# Patient Record
Sex: Male | Born: 1990 | Race: Black or African American | Hispanic: No | Marital: Single | Smoking: Current every day smoker
Health system: Southern US, Community
[De-identification: ages and names within clinical notes are randomized; demographics above are authoritative.]

---
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
## 2011-12-06 ENCOUNTER — Encounter (HOSPITAL_COMMUNITY): Payer: Self-pay | Admitting: Emergency Medicine

## 2011-12-06 ENCOUNTER — Emergency Department (HOSPITAL_COMMUNITY)
Admission: EM | Admit: 2011-12-06 | Discharge: 2011-12-07 | Disposition: A | Discharge status: Home / Self Care | Payer: Self-pay | Attending: Emergency Medicine | Admitting: Emergency Medicine

## 2011-12-06 DIAGNOSIS — R369 Urethral discharge, unspecified: Secondary | ICD-10-CM | POA: Insufficient documentation

## 2011-12-06 DIAGNOSIS — R319 Hematuria, unspecified: Secondary | ICD-10-CM | POA: Insufficient documentation

## 2011-12-06 DIAGNOSIS — R300 Dysuria: Secondary | ICD-10-CM | POA: Insufficient documentation

## 2011-12-06 DIAGNOSIS — N342 Other urethritis: Secondary | ICD-10-CM | POA: Insufficient documentation

## 2011-12-06 NOTE — ED Notes (Signed)
Pt alert, nad, c/o urinary discharge, burning, onset this evening, resp even unlabored, skin pwd

## 2011-12-07 LAB — URINALYSIS, ROUTINE W REFLEX MICROSCOPIC
Glucose, UA: NEGATIVE mg/dL
Ketones, ur: NEGATIVE mg/dL
Specific Gravity, Urine: 1.028 (ref 1.005–1.030)
Urobilinogen, UA: 0.2 mg/dL (ref 0.0–1.0)
pH: 6.5 (ref 5.0–8.0)

## 2011-12-07 LAB — URINE MICROSCOPIC-ADD ON

## 2011-12-07 LAB — GC/CHLAMYDIA PROBE AMP, GENITAL
Chlamydia, DNA Probe: POSITIVE — AB
GC Probe Amp, Genital: NEGATIVE

## 2011-12-07 MED ORDER — AZITHROMYCIN 250 MG PO TABS
1000.0000 mg | ORAL_TABLET | Freq: Once | ORAL | Status: AC
  Administered 2011-12-07: 1000 mg via ORAL
  Filled 2011-12-07: qty 4

## 2011-12-07 MED ORDER — METRONIDAZOLE 500 MG PO TABS
2000.0000 mg | ORAL_TABLET | Freq: Once | ORAL | Status: AC
  Administered 2011-12-07: 2000 mg via ORAL
  Filled 2011-12-07: qty 4

## 2011-12-07 MED ORDER — CEFTRIAXONE SODIUM 250 MG IJ SOLR
250.0000 mg | Freq: Once | INTRAMUSCULAR | Status: AC
  Administered 2011-12-07: 250 mg via INTRAMUSCULAR
  Filled 2011-12-07: qty 250

## 2011-12-07 MED ORDER — LIDOCAINE HCL 1 % IJ SOLN
INTRAMUSCULAR | Status: AC
  Administered 2011-12-07: 20 mL
  Filled 2011-12-07: qty 20

## 2011-12-07 NOTE — ED Provider Notes (Signed)
History     CSN: [id number]  Arrival date & time 12/06/11  2352   First MD Initiated Contact with Patient 12/07/11 0043      Chief Complaint  Patient presents with  . Urinary Tract Infection     HPI  History provided by the patient. Patient is a 21 year old male with no significant past medical history who presents with reports of having acute onset of blood in his urine his evening. he first noticed blood in the total after urinating. He reports having slight urinary discharge as well and possible burning sensation. Symptoms were acute and episodic. He denies any other aggravating or alleviating factors. Those are described as mild. He denies any testicular pain or swelling. Denies rash or change of skin. He denies having similar symptoms previously. Patient is sexually active but reports using condoms every time.    History reviewed. No pertinent past medical history.  History reviewed. No pertinent past surgical history.  No family history on file.  History  Substance Use Topics  . Smoking status: Current Everyday Smoker -- 1.0 packs/day    Types: Cigarettes  . Smokeless tobacco: Not on file  . Alcohol Use: No      Review of Systems  Constitutional: Negative for fever and chills.  Genitourinary: Positive for hematuria and discharge. Negative for frequency, flank pain, penile swelling, penile pain and testicular pain.  All other systems reviewed and are negative.    Allergies  Aspirin and Keflex  Home Medications  No current outpatient prescriptions on file.  BP 128/79  Pulse 70  Temp 98 F (36.7 C)  Resp 16  Wt 155 lb (70.308 kg)  SpO2 99%  Physical Exam  Nursing note and vitals reviewed. Constitutional: He is oriented to person, place, and time. He appears well-developed and well-nourished. No distress.  HENT:  Head: Normocephalic.  Cardiovascular: Normal rate and regular rhythm.   Pulmonary/Chest: Effort normal and breath sounds normal.  Abdominal:  Soft. Bowel sounds are normal. He exhibits no distension. There is no tenderness. There is no rebound, no guarding and no CVA tenderness. Hernia confirmed negative in the right inguinal area and confirmed negative in the left inguinal area.  Genitourinary: Testes normal and penis normal. Right testis shows no swelling and no tenderness. Left testis shows no swelling and no tenderness. Circumcised. No penile erythema or penile tenderness. No discharge found.  Lymphadenopathy:       Right: No inguinal adenopathy present.       Left: No inguinal adenopathy present.  Neurological: He is alert and oriented to person, place, and time.  Skin: Skin is warm. No rash noted.  Psychiatric: He has a normal mood and affect.    ED Course  Procedures    Labs Reviewed  URINALYSIS, ROUTINE W REFLEX MICROSCOPIC  URINE CULTURE  GC/CHLAMYDIA PROBE AMP, GENITAL   Results for orders placed during the hospital encounter of 12/06/11  URINALYSIS, ROUTINE W REFLEX MICROSCOPIC      Component Value Range   Color, Urine YELLOW  YELLOW    APPearance CLEAR  CLEAR    Specific Gravity, Urine 1.028  1.005 - 1.030    pH 6.5  5.0 - 8.0    Glucose, UA NEGATIVE  NEGATIVE (mg/dL)   Hgb urine dipstick NEGATIVE  NEGATIVE    Bilirubin Urine NEGATIVE  NEGATIVE    Ketones, ur NEGATIVE  NEGATIVE (mg/dL)   Protein, ur NEGATIVE  NEGATIVE (mg/dL)   Urobilinogen, UA 0.2  0.0 - 1.0 (mg/dL)  Nitrite NEGATIVE  NEGATIVE    Leukocytes, UA TRACE (*) NEGATIVE   URINE MICROSCOPIC-ADD ON      Component Value Range   Squamous Epithelial / LPF RARE  RARE    WBC, UA 3-6  <3 (WBC/hpf)   RBC / HPF 0-2  <3 (RBC/hpf)   Bacteria, UA RARE  RARE    Urine-Other MUCOUS PRESENT       1. Dysuria   2. Urethritis       MDM  12:45 AM patient seen and evaluated. Patient in no acute distress.   Patient with 3-6 white blood cells in urine and mucous present. No penile discharge. Given age and symptoms will treat for possible STD. Patient  given Rocephin IM, azithromycin and Flagyl. He was also given instructions to followup with [facility] STD clinic.     Angus Seller, PA 12/07/11 0543  Medical screening examination/treatment/procedure(s) were performed by non-physician practitioner and as supervising physician I was immediately available for consultation/collaboration.  Sunnie Nielsen, MD 12/07/11 [phone]

## 2011-12-07 NOTE — Discharge Instructions (Signed)
You were seen and evaluated today for symptoms of blood in your urine as well as discomfort with urination. At this time you were treated for possible sexually transmitted disease infection. Please followup on your results in the next 2-3 days as well as followup with a [facility] health Department sexually transmitted disease clinic for continued evaluation and treatment of your symptoms. If you have any positive tests is important to inform all of your sexual partners. It is recommend you not have intercourse until you have your results back.  Dysuria Dysuria is the medical term for pain with urination. There are many causes for dysuria, but urinary tract infection is the most common. If a urinalysis was performed it can show that there is a urinary tract infection. A urine culture confirms that you or your child is sick. You will need to follow up with a healthcare provider because:  If a urine culture was done you will need to know the culture results and treatment recommendations.   If the urine culture was positive, you or your child will need to be put on antibiotics or know if the antibiotics prescribed are the right antibiotics for your urinary tract infection.   If the urine culture is negative (no urinary tract infection), then other causes may need to be explored or antibiotics need to be stopped.  Today laboratory work may have been done and there does not seem to be an infection. If cultures were done they will take at least 24 to 48 hours to be completed. Today x-rays may have been taken and they read as normal. No cause can be found for the problems. The x-rays may be re-read by a radiologist and you will be contacted if additional findings are made. You or your child may have been put on medications to help with this problem until you can see your primary caregiver. If the problems get better, see your primary caregiver if the problems return. If you were given antibiotics  (medications which kill germs), take all of the mediations as directed for the full course of treatment.  If laboratory work was done, you need to find the results. Leave a telephone number where you can be reached. If this is not possible, make sure you find out how you are to get test results. HOME CARE INSTRUCTIONS   Drink lots of fluids. For adults, drink eight, 8 ounce glasses of clear juice or water a day. For children, replace fluids as suggested by your caregiver.   Empty the bladder often. Avoid holding urine for long periods of time.   After a bowel movement, women should cleanse front to back, using each tissue only once.   Empty your bladder before and after sexual intercourse.   Take all the medicine given to you until it is gone. You may feel better in a few days, but TAKE ALL MEDICINE.   Avoid caffeine, tea, alcohol and carbonated beverages, because they tend to irritate the bladder.   In men, alcohol may irritate the prostate.   Only take over-the-counter or prescription medicines for pain, discomfort, or fever as directed by your caregiver.   If your caregiver has given you a follow-up appointment, it is very important to keep that appointment. Not keeping the appointment could result in a chronic or permanent injury, pain, and disability. If there is any problem keeping the appointment, you must call back to this facility for assistance.  SEEK IMMEDIATE MEDICAL CARE IF:   Back pain develops.  A fever develops.   There is nausea (feeling sick to your stomach) or vomiting (throwing up).   Problems are no better with medications or are getting worse.  MAKE SURE YOU:   Understand these instructions.   Will watch your condition.   Will get help right away if you are not doing well or get worse.  Document Released: 07/02/2004 Document Revised: 06/16/2011 Document Reviewed: 05/09/2008 [facility] Patient Information 2012 [place], [place].

## 2011-12-08 LAB — URINE CULTURE: Colony Count: NO GROWTH

## 2011-12-08 NOTE — ED Notes (Addendum)
+   Chlamydia Patient treated with rocephin and zithromax-sensitive to same-chart appended per protocol MD.

## 2011-12-08 NOTE — ED Notes (Signed)
Patient informed of positive results after Id'd x 2 and informed of need to notify partner to be treated. 

## 2018-07-28 ENCOUNTER — Emergency Department (HOSPITAL_BASED_OUTPATIENT_CLINIC_OR_DEPARTMENT_OTHER)
Admission: EM | Admit: 2018-07-28 | Discharge: 2018-07-28 | Disposition: A | Discharge status: Home / Self Care | Payer: Self-pay | Attending: Emergency Medicine | Admitting: Emergency Medicine

## 2018-07-28 ENCOUNTER — Other Ambulatory Visit: Payer: Self-pay

## 2018-07-28 ENCOUNTER — Encounter (HOSPITAL_BASED_OUTPATIENT_CLINIC_OR_DEPARTMENT_OTHER): Payer: Self-pay | Admitting: *Deleted

## 2018-07-28 DIAGNOSIS — N341 Nonspecific urethritis: Secondary | ICD-10-CM | POA: Insufficient documentation

## 2018-07-28 DIAGNOSIS — A64 Unspecified sexually transmitted disease: Secondary | ICD-10-CM

## 2018-07-28 DIAGNOSIS — F17210 Nicotine dependence, cigarettes, uncomplicated: Secondary | ICD-10-CM | POA: Insufficient documentation

## 2018-07-28 DIAGNOSIS — N342 Other urethritis: Secondary | ICD-10-CM

## 2018-07-28 DIAGNOSIS — A568 Sexually transmitted chlamydial infection of other sites: Secondary | ICD-10-CM | POA: Insufficient documentation

## 2018-07-28 MED ORDER — AZITHROMYCIN 1 G PO PACK
1.0000 g | PACK | Freq: Once | ORAL | Status: AC
  Administered 2018-07-28: 1 g via ORAL

## 2018-07-28 MED ORDER — AZITHROMYCIN 1 G PO PACK
PACK | ORAL | Status: AC
  Filled 2018-07-28: qty 1

## 2018-07-28 MED ORDER — GENTAMICIN SULFATE 40 MG/ML IJ SOLN
240.0000 mg | Freq: Once | INTRAMUSCULAR | Status: AC
  Administered 2018-07-28: 240 mg via INTRAMUSCULAR
  Filled 2018-07-28 (×2): qty 6

## 2018-07-28 MED ORDER — METRONIDAZOLE 500 MG PO TABS
2000.0000 mg | ORAL_TABLET | Freq: Once | ORAL | Status: AC
  Administered 2018-07-28: 2000 mg via ORAL

## 2018-07-28 MED ORDER — METRONIDAZOLE 500 MG PO TABS
ORAL_TABLET | ORAL | Status: AC
  Filled 2018-07-28: qty 4

## 2018-07-28 NOTE — Discharge Instructions (Addendum)
No sexual activity of any kind until 7 days after all partners treated, not even with a condom

## 2018-07-28 NOTE — ED Notes (Signed)
Patient is A&Ox4.  No signs of distress noted.  Please see providers complete history and physical exam.  

## 2018-07-28 NOTE — ED Notes (Signed)
PT states understanding of care given, follow up care, and medication prescribed. PT ambulated from ED to car with a steady gait. 

## 2018-07-28 NOTE — ED Triage Notes (Signed)
Pt c/o painful urination  And penis discharge x 2 days

## 2018-07-28 NOTE — ED Provider Notes (Addendum)
[facility] EMERGENCY DEPARTMENT Provider Note   CSN: [id number] Arrival date & time: 07/28/18  0017     History   Chief Complaint Chief Complaint  Patient presents with  . Penile Discharge    HPI Joshua Rowland is a 27 y.o. male.  The history is provided by the patient.  Penile Discharge  This is a new problem. The current episode started more than 2 days ago. The problem occurs constantly. The problem has not changed since onset.Pertinent negatives include no chest pain, no abdominal pain, no headaches and no shortness of breath. Nothing aggravates the symptoms. Nothing relieves the symptoms. He has tried nothing for the symptoms. The treatment provided no relief.  Reports condom broke.  Green discharge, copious.    History reviewed. No pertinent past medical history.  There are no active problems to display for this patient.   History reviewed. No pertinent surgical history.      Home Medications    Prior to Admission medications   Not on File    Family History History reviewed. No pertinent family history.  Social History Social History   Tobacco Use  . Smoking status: Current Every Day Smoker    Packs/day: 0.50    Types: Cigarettes  . Smokeless tobacco: Never Used  Substance Use Topics  . Alcohol use: No  . Drug use: Yes    Types: Marijuana     Allergies   Cephalexin and Aspirin   Review of Systems Review of Systems  Constitutional: Negative for fever.  HENT: Negative for sore throat.   Respiratory: Negative for shortness of breath.   Cardiovascular: Negative for chest pain.  Gastrointestinal: Negative for abdominal pain.  Genitourinary: Positive for discharge and dysuria.  Neurological: Negative for headaches.  All other systems reviewed and are negative.    Physical Exam Updated Vital Signs BP 124/89   Pulse (!) 116   Temp 98.5 F (36.9 C) (Oral)   Resp 16   Ht 5\' 9"  (1.753 m)   Wt 63.5 kg   SpO2 100%   BMI  20.67 kg/m   Physical Exam  Constitutional: He is oriented to person, place, and time. He appears well-developed and well-nourished. No distress.  HENT:  Head: Normocephalic and atraumatic.  Mouth/Throat: No oropharyngeal exudate.  Eyes: Pupils are equal, round, and reactive to light. Conjunctivae are normal.  Neck: Normal range of motion. Neck supple.  Cardiovascular: Normal rate, regular rhythm, normal heart sounds and intact distal pulses.  Pulmonary/Chest: Effort normal and breath sounds normal. No stridor. He has no wheezes. He has no rales.  Abdominal: Soft. Bowel sounds are normal. There is no tenderness.  Genitourinary:  Genitourinary Comments: Chaperone present copious green discharge  Musculoskeletal: Normal range of motion.  Neurological: He is alert and oriented to person, place, and time. He displays normal reflexes.  Skin: Skin is warm and dry. Capillary refill takes less than 2 seconds.     ED Treatments / Results  Labs (all labs ordered are listed, but only abnormal results are displayed) Labs Reviewed  GC/CHLAMYDIA PROBE AMP ([place]) NOT AT [facility]    EKG None  Radiology No results found.  Procedures Procedures (including critical care time)  Medications Ordered in ED Medications  gentamicin (GARAMYCIN) injection 240 mg (has no administration in time range)  metroNIDAZOLE (FLAGYL) 500 MG tablet (has no administration in time range)  azithromycin (ZITHROMAX) 1 g powder (has no administration in time range)  azithromycin (ZITHROMAX) powder 1 g (1 g Oral  Given 07/28/18 0130)  metroNIDAZOLE (FLAGYL) tablet 2,000 mg (2,000 mg Oral Given 07/28/18 0131)   Given patient's allergy to keflex called pharmacy to researched antibiotic choice, gentamycin IM was recommended based on guidelines and this medication was sent via courier to [facility].     Final Clinical Impressions(s) / ED Diagnoses   No sexual activity until 7 days after all partners treated.    Return  for weakness, numbness, changes in vision or speech, fevers >100.4 unrelieved by medication, shortness of breath, intractable vomiting, or diarrhea, abdominal pain, Inability to tolerate liquids or food, cough, altered mental status or any concerns. No signs of systemic illness or infection. The patient is nontoxic-appearing on exam and vital signs are within normal limits.    I have reviewed the triage vital signs and the nursing notes. Pertinent labs &imaging results that were available during my care of the patient were reviewed by me and considered in my medical decision making (see chart for details).  After history, exam, and medical workup I feel the patient has been appropriately medically screened and is safe for discharge home. Pertinent diagnoses were discussed with the patient. Patient was given return precautions.    Mitsuko Luera, MD 07/28/18 1610    Cy Blamer, MD 07/28/18 [id number]

## 2018-07-31 LAB — GC/CHLAMYDIA PROBE AMP (~~LOC~~) NOT AT ARMC
Chlamydia: POSITIVE — AB
NEISSERIA GONORRHEA: POSITIVE — AB
